# Patient Record
Sex: Male | Born: 2000 | Race: White | Hispanic: No | Marital: Single | Smoking: Former smoker
Health system: Southern US, Community
[De-identification: ages and names within clinical notes are randomized; demographics above are authoritative.]

## PROBLEM LIST (undated history)

## (undated) ENCOUNTER — Emergency Department (HOSPITAL_COMMUNITY): Admission: EM | Payer: Self-pay | Source: Home / Self Care

## (undated) DIAGNOSIS — J45909 Unspecified asthma, uncomplicated: Secondary | ICD-10-CM

---
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
## 2018-12-21 ENCOUNTER — Emergency Department (HOSPITAL_COMMUNITY): Payer: Self-pay

## 2018-12-21 ENCOUNTER — Other Ambulatory Visit: Payer: Self-pay

## 2018-12-21 ENCOUNTER — Emergency Department (HOSPITAL_COMMUNITY)
Admission: EM | Admit: 2018-12-21 | Discharge: 2018-12-21 | Disposition: A | Discharge status: Home / Self Care | Payer: Self-pay | Attending: Emergency Medicine | Admitting: Emergency Medicine

## 2018-12-21 ENCOUNTER — Encounter (HOSPITAL_COMMUNITY): Payer: Self-pay | Admitting: Emergency Medicine

## 2018-12-21 DIAGNOSIS — S42114A Nondisplaced fracture of body of scapula, right shoulder, initial encounter for closed fracture: Secondary | ICD-10-CM | POA: Insufficient documentation

## 2018-12-21 DIAGNOSIS — Y92828 Other wilderness area as the place of occurrence of the external cause: Secondary | ICD-10-CM | POA: Insufficient documentation

## 2018-12-21 DIAGNOSIS — Y9389 Activity, other specified: Secondary | ICD-10-CM | POA: Insufficient documentation

## 2018-12-21 DIAGNOSIS — J45909 Unspecified asthma, uncomplicated: Secondary | ICD-10-CM | POA: Insufficient documentation

## 2018-12-21 DIAGNOSIS — F17210 Nicotine dependence, cigarettes, uncomplicated: Secondary | ICD-10-CM | POA: Insufficient documentation

## 2018-12-21 DIAGNOSIS — Y998 Other external cause status: Secondary | ICD-10-CM | POA: Insufficient documentation

## 2018-12-21 HISTORY — DX: Unspecified asthma, uncomplicated: J45.909

## 2018-12-21 LAB — I-STAT CHEM 8, ED
BUN: 18 mg/dL (ref 6–20)
Calcium, Ion: 1.18 mmol/L (ref 1.15–1.40)
Chloride: 103 mmol/L (ref 98–111)
Creatinine, Ser: 1 mg/dL (ref 0.61–1.24)
Glucose, Bld: 78 mg/dL (ref 70–99)
HCT: 44 % (ref 39.0–52.0)
Hemoglobin: 15 g/dL (ref 13.0–17.0)
Potassium: 4.1 mmol/L (ref 3.5–5.1)
Sodium: 139 mmol/L (ref 135–145)
TCO2: 28 mmol/L (ref 22–32)

## 2018-12-21 LAB — I-STAT CREATININE, ED: Creatinine, Ser: 1 mg/dL (ref 0.61–1.24)

## 2018-12-21 MED ORDER — NAPROXEN 250 MG PO TABS
500.0000 mg | ORAL_TABLET | Freq: Once | ORAL | Status: AC
  Administered 2018-12-21: 500 mg via ORAL
  Filled 2018-12-21: qty 2

## 2018-12-21 MED ORDER — ACETAMINOPHEN 500 MG PO TABS
1000.0000 mg | ORAL_TABLET | Freq: Once | ORAL | Status: AC
  Administered 2018-12-21: 1000 mg via ORAL
  Filled 2018-12-21: qty 2

## 2018-12-21 MED ORDER — IOHEXOL 300 MG/ML  SOLN
100.0000 mL | Freq: Once | INTRAMUSCULAR | Status: AC | PRN
  Administered 2018-12-21: 75 mL via INTRAVENOUS

## 2018-12-21 MED ORDER — OXYCODONE HCL 5 MG PO TABS: 5.0000 mg | ORAL_TABLET | ORAL | 0 refills | Status: AC | PRN
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
## 2022-03-24 ENCOUNTER — Encounter (HOSPITAL_COMMUNITY): Payer: Self-pay | Admitting: Emergency Medicine

## 2022-03-24 ENCOUNTER — Emergency Department (HOSPITAL_COMMUNITY): Payer: No Typology Code available for payment source

## 2022-03-24 ENCOUNTER — Emergency Department (HOSPITAL_COMMUNITY)
Admission: EM | Admit: 2022-03-24 | Discharge: 2022-03-24 | Disposition: A | Discharge status: Home / Self Care | Payer: No Typology Code available for payment source | Attending: Emergency Medicine | Admitting: Emergency Medicine

## 2022-03-24 ENCOUNTER — Other Ambulatory Visit: Payer: Self-pay

## 2022-03-24 DIAGNOSIS — S40812A Abrasion of left upper arm, initial encounter: Secondary | ICD-10-CM | POA: Insufficient documentation

## 2022-03-24 DIAGNOSIS — R0789 Other chest pain: Secondary | ICD-10-CM | POA: Insufficient documentation

## 2022-03-24 DIAGNOSIS — S060X0A Concussion without loss of consciousness, initial encounter: Secondary | ICD-10-CM | POA: Insufficient documentation

## 2022-03-24 DIAGNOSIS — S0990XA Unspecified injury of head, initial encounter: Secondary | ICD-10-CM | POA: Diagnosis present

## 2022-03-24 DIAGNOSIS — T07XXXA Unspecified multiple injuries, initial encounter: Secondary | ICD-10-CM

## 2022-03-24 DIAGNOSIS — S40811A Abrasion of right upper arm, initial encounter: Secondary | ICD-10-CM | POA: Insufficient documentation

## 2022-03-24 DIAGNOSIS — Y9355 Activity, bike riding: Secondary | ICD-10-CM | POA: Insufficient documentation

## 2022-03-24 DIAGNOSIS — S30810A Abrasion of lower back and pelvis, initial encounter: Secondary | ICD-10-CM | POA: Insufficient documentation

## 2022-03-24 LAB — CBC WITH DIFFERENTIAL/PLATELET
Abs Immature Granulocytes: 0.09 10*3/uL — ABNORMAL HIGH (ref 0.00–0.07)
Basophils Absolute: 0 10*3/uL (ref 0.0–0.1)
Basophils Relative: 0 %
Eosinophils Absolute: 0.1 10*3/uL (ref 0.0–0.5)
Eosinophils Relative: 1 %
HCT: 46.8 % (ref 39.0–52.0)
Hemoglobin: 16.2 g/dL (ref 13.0–17.0)
Immature Granulocytes: 1 %
Lymphocytes Relative: 20 %
Lymphs Abs: 2.2 10*3/uL (ref 0.7–4.0)
MCH: 32 pg (ref 26.0–34.0)
MCHC: 34.6 g/dL (ref 30.0–36.0)
MCV: 92.3 fL (ref 80.0–100.0)
Monocytes Absolute: 0.7 10*3/uL (ref 0.1–1.0)
Monocytes Relative: 6 %
Neutro Abs: 7.9 10*3/uL — ABNORMAL HIGH (ref 1.7–7.7)
Neutrophils Relative %: 72 %
Platelets: 191 10*3/uL (ref 150–400)
RBC: 5.07 MIL/uL (ref 4.22–5.81)
RDW: 12.9 % (ref 11.5–15.5)
WBC: 11.1 10*3/uL — ABNORMAL HIGH (ref 4.0–10.5)
nRBC: 0 % (ref 0.0–0.2)

## 2022-03-24 LAB — COMPREHENSIVE METABOLIC PANEL
ALT: 32 U/L (ref 0–44)
AST: 32 U/L (ref 15–41)
Albumin: 4.7 g/dL (ref 3.5–5.0)
Alkaline Phosphatase: 81 U/L (ref 38–126)
Anion gap: 9 (ref 5–15)
BUN: 23 mg/dL — ABNORMAL HIGH (ref 6–20)
CO2: 26 mmol/L (ref 22–32)
Calcium: 9.1 mg/dL (ref 8.9–10.3)
Chloride: 103 mmol/L (ref 98–111)
Creatinine, Ser: 1.1 mg/dL (ref 0.61–1.24)
GFR, Estimated: >60 mL/min (ref >60)
Glucose, Bld: 97 mg/dL (ref 70–99)
Potassium: 3.9 mmol/L (ref 3.5–5.1)
Sodium: 138 mmol/L (ref 135–145)
Total Bilirubin: 0.7 mg/dL (ref 0.3–1.2)
Total Protein: 8 g/dL (ref 6.5–8.1)

## 2022-03-24 LAB — ETHANOL: Alcohol, Ethyl (B): <10 mg/dL (ref <10)

## 2022-03-24 MED ORDER — HYDROCODONE-ACETAMINOPHEN 5-325 MG PO TABS: 1.0000 | ORAL_TABLET | ORAL | 0 refills | Status: AC | PRN

## 2022-03-24 MED ORDER — IOHEXOL 300 MG/ML  SOLN
100.0000 mL | Freq: Once | INTRAMUSCULAR | Status: AC | PRN
  Administered 2022-03-24: 100 mL via INTRAVENOUS

## 2022-03-24 MED ORDER — FENTANYL CITRATE (PF) 100 MCG/2ML IJ SOLN
100.0000 ug | Freq: Once | INTRAMUSCULAR | Status: AC
  Administered 2022-03-24: 100 ug via INTRAVENOUS
  Filled 2022-03-24: qty 2
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
## 2022-03-24 NOTE — ED Notes (Signed)
Pt verbalized understanding of discharge instructions. Opportunity for questions provided.  

## 2022-03-24 NOTE — ED Notes (Signed)
ED Provider at bedside. 

## 2022-03-24 NOTE — ED Notes (Signed)
Pt's L arm/wrist/hand and R wrist wrapped w/ petroleum dressing and non-adherent dressing.  Additional supplies given to Pt.

## 2023-09-27 DEATH — deceased
